# Patient Record
Sex: Male | Born: 1985 | Race: White | Hispanic: No | Marital: Married | Smoking: Current every day smoker
Health system: Southern US, Community
[De-identification: ages and names within clinical notes are randomized; demographics above are authoritative.]

---

## 2017-03-28 ENCOUNTER — Encounter (HOSPITAL_COMMUNITY): Payer: Self-pay

## 2017-03-28 ENCOUNTER — Emergency Department (HOSPITAL_COMMUNITY): Payer: Self-pay

## 2017-03-28 ENCOUNTER — Emergency Department (HOSPITAL_COMMUNITY)
Admission: EM | Admit: 2017-03-28 | Discharge: 2017-03-28 | Disposition: A | Discharge status: Home / Self Care | Payer: Self-pay | Attending: Emergency Medicine | Admitting: Emergency Medicine

## 2017-03-28 DIAGNOSIS — F17210 Nicotine dependence, cigarettes, uncomplicated: Secondary | ICD-10-CM | POA: Insufficient documentation

## 2017-03-28 DIAGNOSIS — K2920 Alcoholic gastritis without bleeding: Secondary | ICD-10-CM | POA: Insufficient documentation

## 2017-03-28 LAB — COMPREHENSIVE METABOLIC PANEL
ALK PHOS: 109 U/L (ref 38–126)
ALT: 108 U/L — AB (ref 17–63)
AST: 99 U/L — AB (ref 15–41)
Albumin: 4.6 g/dL (ref 3.5–5.0)
Anion gap: 12 (ref 5–15)
BUN: 8 mg/dL (ref 6–20)
CALCIUM: 9.4 mg/dL (ref 8.9–10.3)
CO2: 26 mmol/L (ref 22–32)
CREATININE: 0.86 mg/dL (ref 0.61–1.24)
Chloride: 101 mmol/L (ref 101–111)
Glucose, Bld: 102 mg/dL — ABNORMAL HIGH (ref 65–99)
Potassium: 3.8 mmol/L (ref 3.5–5.1)
Sodium: 139 mmol/L (ref 135–145)
Total Bilirubin: 1.2 mg/dL (ref 0.3–1.2)
Total Protein: 8.3 g/dL — ABNORMAL HIGH (ref 6.5–8.1)

## 2017-03-28 LAB — URINALYSIS, ROUTINE W REFLEX MICROSCOPIC
Bacteria, UA: NONE SEEN
Bilirubin Urine: NEGATIVE
GLUCOSE, UA: NEGATIVE mg/dL
KETONES UR: NEGATIVE mg/dL
LEUKOCYTES UA: NEGATIVE
Nitrite: NEGATIVE
PH: 7 (ref 5.0–8.0)
Protein, ur: >=300 mg/dL — AB
SPECIFIC GRAVITY, URINE: 1.019 (ref 1.005–1.030)
SQUAMOUS EPITHELIAL / LPF: NONE SEEN

## 2017-03-28 LAB — I-STAT TROPONIN, ED: Troponin i, poc: 0 ng/mL (ref 0.00–0.08)

## 2017-03-28 LAB — CBC
HCT: 48.3 % (ref 39.0–52.0)
Hemoglobin: 17.2 g/dL — ABNORMAL HIGH (ref 13.0–17.0)
MCH: 33.2 pg (ref 26.0–34.0)
MCHC: 35.6 g/dL (ref 30.0–36.0)
MCV: 93.2 fL (ref 78.0–100.0)
PLATELETS: 357 10*3/uL (ref 150–400)
RBC: 5.18 MIL/uL (ref 4.22–5.81)
RDW: 12.2 % (ref 11.5–15.5)
WBC: 13.5 10*3/uL — AB (ref 4.0–10.5)

## 2017-03-28 LAB — LIPASE, BLOOD: Lipase: 26 U/L (ref 11–51)

## 2017-03-28 MED ORDER — SODIUM CHLORIDE 0.9 % IV BOLUS (SEPSIS)
1000.0000 mL | Freq: Once | INTRAVENOUS | Status: AC
  Administered 2017-03-28: 1000 mL via INTRAVENOUS

## 2017-03-28 MED ORDER — ONDANSETRON 8 MG PO TBDP: ORAL_TABLET | ORAL | 0 refills | Status: AC

## 2017-03-28 MED ORDER — PANTOPRAZOLE SODIUM 20 MG PO TBEC: 20.0000 mg | DELAYED_RELEASE_TABLET | Freq: Two times a day (BID) | ORAL | 0 refills | Status: AC

## 2017-03-28 MED ORDER — GI COCKTAIL ~~LOC~~
30.0000 mL | Freq: Once | ORAL | Status: AC
  Administered 2017-03-28: 30 mL via ORAL
  Filled 2017-03-28: qty 30

## 2017-03-28 MED ORDER — ONDANSETRON 4 MG PO TBDP
4.0000 mg | ORAL_TABLET | Freq: Once | ORAL | Status: AC | PRN
  Administered 2017-03-28: 4 mg via ORAL
  Filled 2017-03-28: qty 1

## 2017-03-28 NOTE — ED Triage Notes (Signed)
Pt c/o intermittent epigastric pain and emesis x 6 months increasing this morning.  Pain score 6/10.  Pt has not taken anything for pain.  Pt reports he is a heavy drinker and drink every night.

## 2017-03-28 NOTE — ED Provider Notes (Signed)
[facility]-EMERGENCY DEPT Provider Note   CSN: [id number] Arrival date & time: 03/28/17  1353     History   Chief Complaint Chief Complaint  Patient presents with  . Heartburn  . Emesis    HPI Clarence Martin is a 31 y.o. male.  Patient is a 31 year old male with past medical history of chronic alcoholism presenting with complaints of epigastric discomfort. This is been ongoing intermittently for the past several months, but has worsened over the past few days. He reports burning to his epigastric region that is worse with drinking alcohol and food. He denies any fevers or chills. He denies any diarrhea. He states that he had multiple episodes of vomiting this morning and that prompted his visit this evening.   The history is provided by the patient.  Heartburn  This is a new problem. Episode onset: Several months ago. The problem occurs constantly. The problem has been gradually worsening. Exacerbated by: Eating and drinking. Nothing relieves the symptoms. He has tried nothing for the symptoms.    History reviewed. No pertinent past medical history.  There are no active problems to display for this patient.   History reviewed. No pertinent surgical history.     Home Medications    Prior to Admission medications   Not on File    Family History History reviewed. No pertinent family history.  Social History Social History  Substance Use Topics  . Smoking status: Current Every Day Smoker    Packs/day: 0.50    Types: Cigarettes  . Smokeless tobacco: Never Used  . Alcohol use Yes     Comment: heavy     Allergies   Patient has no allergy information on record.   Review of Systems Review of Systems  Gastrointestinal: Positive for heartburn.  All other systems reviewed and are negative.    Physical Exam Updated Vital Signs BP (!) 153/107 (BP Location: Left Arm)   Pulse (!) 105   Temp 99.8 F (37.7 C) (Oral)   Resp 18   Ht 6\' 2"  (1.88 m)   Wt 77.1 kg  (170 lb)   SpO2 100%   BMI 21.83 kg/m   Physical Exam  Constitutional: He is oriented to person, place, and time. He appears well-developed and well-nourished. No distress.  HENT:  Head: Normocephalic and atraumatic.  Mouth/Throat: Oropharynx is clear and moist.  Neck: Normal range of motion. Neck supple.  Cardiovascular: Normal rate and regular rhythm.  Exam reveals no friction rub.   No murmur heard. Pulmonary/Chest: Effort normal and breath sounds normal. No respiratory distress. He has no wheezes. He has no rales.  Abdominal: Soft. Bowel sounds are normal. He exhibits no distension. There is tenderness. There is no rebound and no guarding.  There is mild tenderness to palpation in the epigastric region.  Musculoskeletal: Normal range of motion. He exhibits no edema.  Neurological: He is alert and oriented to person, place, and time. Coordination normal.  Skin: Skin is warm and dry. He is not diaphoretic.  Nursing note and vitals reviewed.    ED Treatments / Results  Labs (all labs ordered are listed, but only abnormal results are displayed) Labs Reviewed  LIPASE, BLOOD  COMPREHENSIVE METABOLIC PANEL  CBC  URINALYSIS, ROUTINE W REFLEX MICROSCOPIC  I-STAT TROPOININ, ED    EKG  EKG Interpretation  Date/Time:  Monday March 28 2017 15:50:04 EDT Ventricular Rate:  92 PR Interval:    QRS Duration: 85 QT Interval:  340 QTC Calculation: 421 R Axis:  102 Text Interpretation:  Sinus rhythm RSR' in V1 or V2, probably normal variant Baseline wander in lead(s) V2 V3 Confirmed by Geoffery LyonseLo, Gabriellia Rempel ([id number]) on 03/28/2017 4:02:26 PM       Radiology No results found.  Procedures Procedures (including critical care time)  Medications Ordered in ED Medications  ondansetron (ZOFRAN-ODT) disintegrating tablet 4 mg (not administered)  sodium chloride 0.9 % bolus 1,000 mL (not administered)  gi cocktail (Maalox,Lidocaine,Donnatal) (not administered)     Initial Impression /  Assessment and Plan / ED Course  I have reviewed the triage vital signs and the nursing notes.  Pertinent labs & imaging results that were available during my care of the patient were reviewed by me and considered in my medical decision making (see chart for details).  Patient presents here with complaints of epigastric burning that has been ongoing for the past several months, then worse today. His presentation sounds like a gastritis, most likely related to alcohol. He is a daily drinker of liquor. He is feeling better after a GI cocktail and IV fluids. He will be discharged with protonix, Zofran, and when necessary follow-up with gastroenterology if he is not improving to discuss an endoscopy.  Final Clinical Impressions(s) / ED Diagnoses   Final diagnoses:  None    New Prescriptions New Prescriptions   No medications on file     Geoffery Lyonselo, Queena Monrreal, MD 03/28/17 1758

## 2017-03-28 NOTE — ED Notes (Signed)
ED Provider at bedside. 

## 2017-03-28 NOTE — Discharge Instructions (Signed)
Protonix as prescribed.  Zofran as prescribed as needed for nausea.  Limit your alcohol intake.  Follow[facility] if your symptoms are not improving in the next several days. The contact information for [facility] has been provided in this discharge summary for you to call and make these arrangements.

## 2018-03-09 IMAGING — CR DG CHEST 2V
2 series · 2 of 2 positions shown · non-contrast
Comparison: None in PACs

CLINICAL DATA: Intermittent epigastric pain and emesis for the past
6 months with increased severity this morning.

EXAM:
CHEST  2 VIEW

[w chest pa]
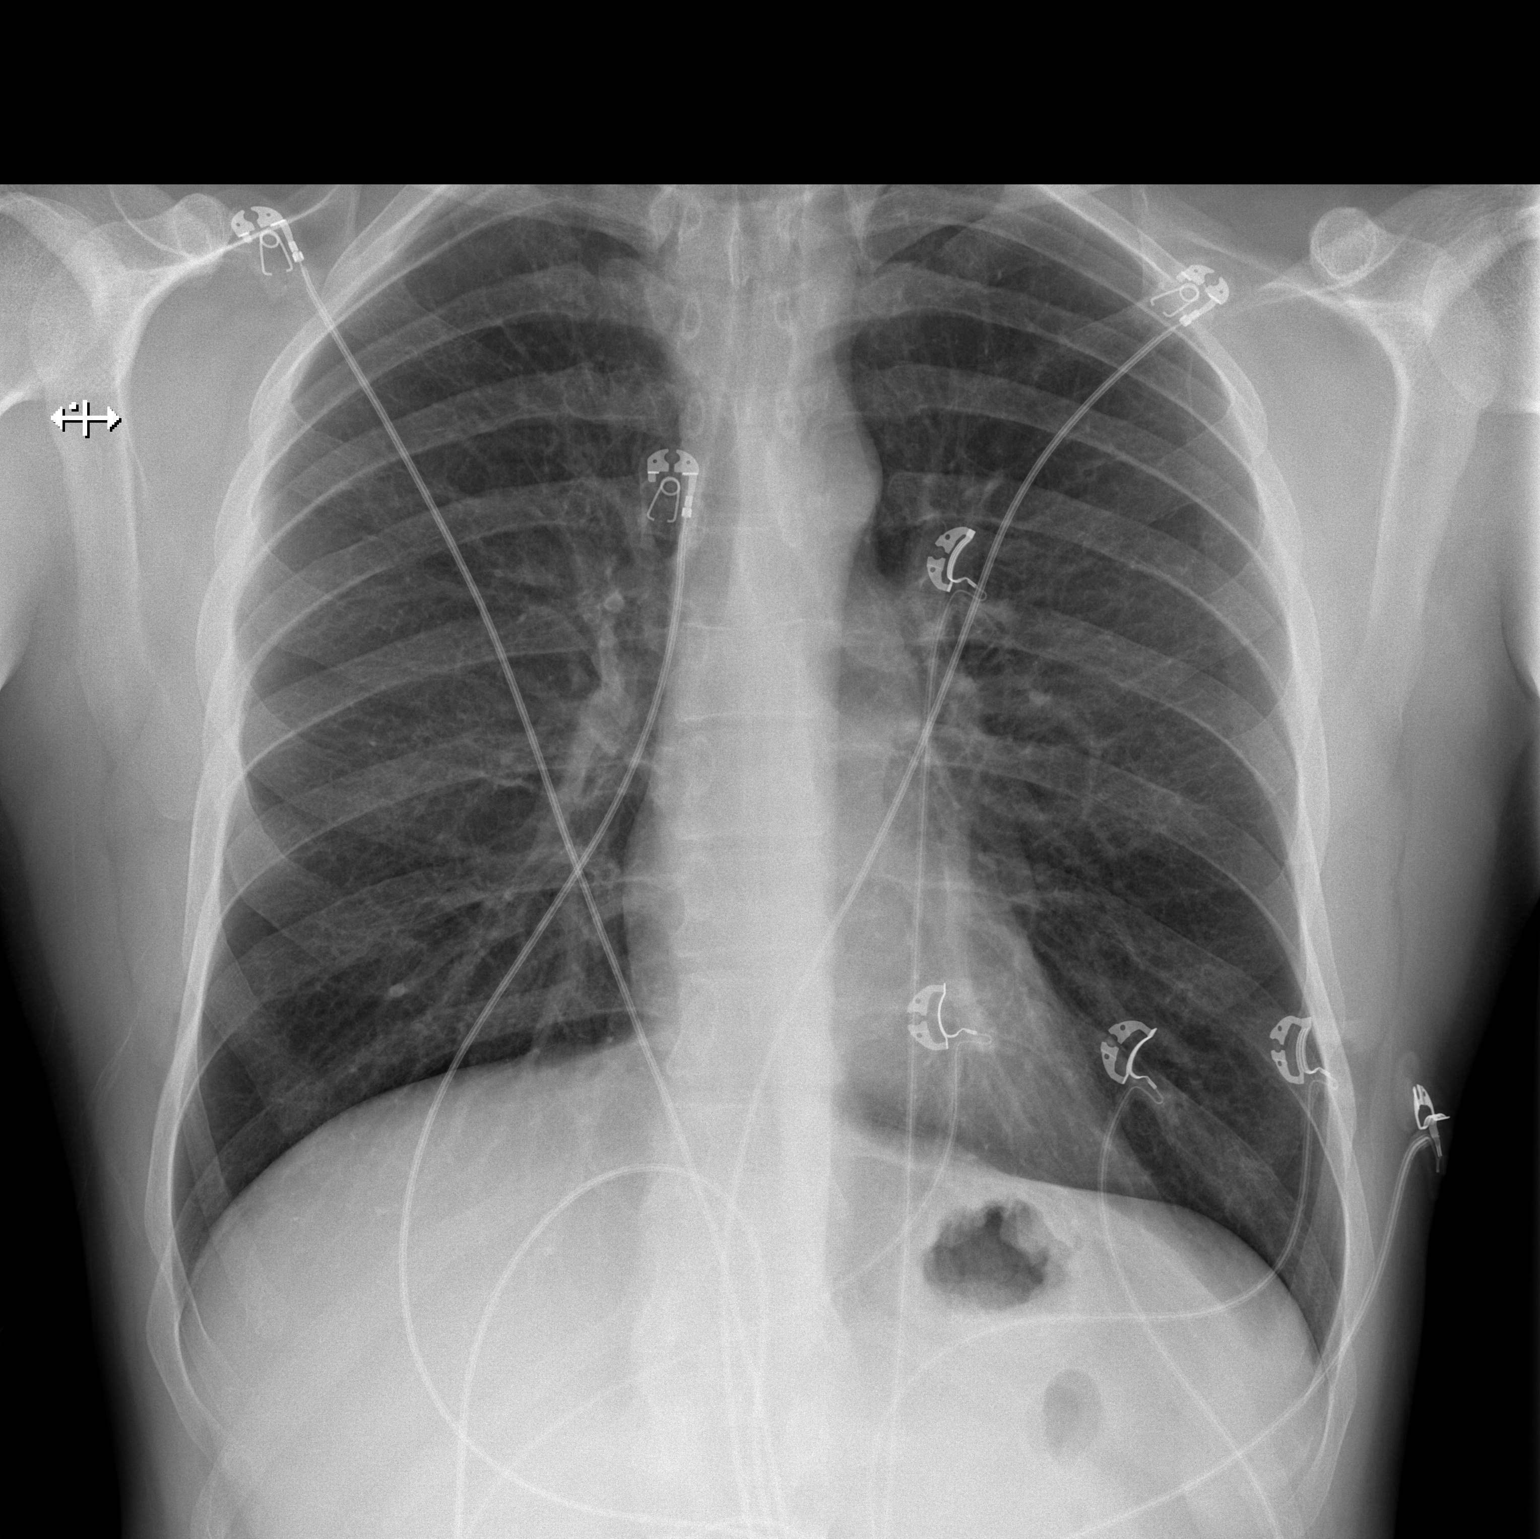

[w chest lat]
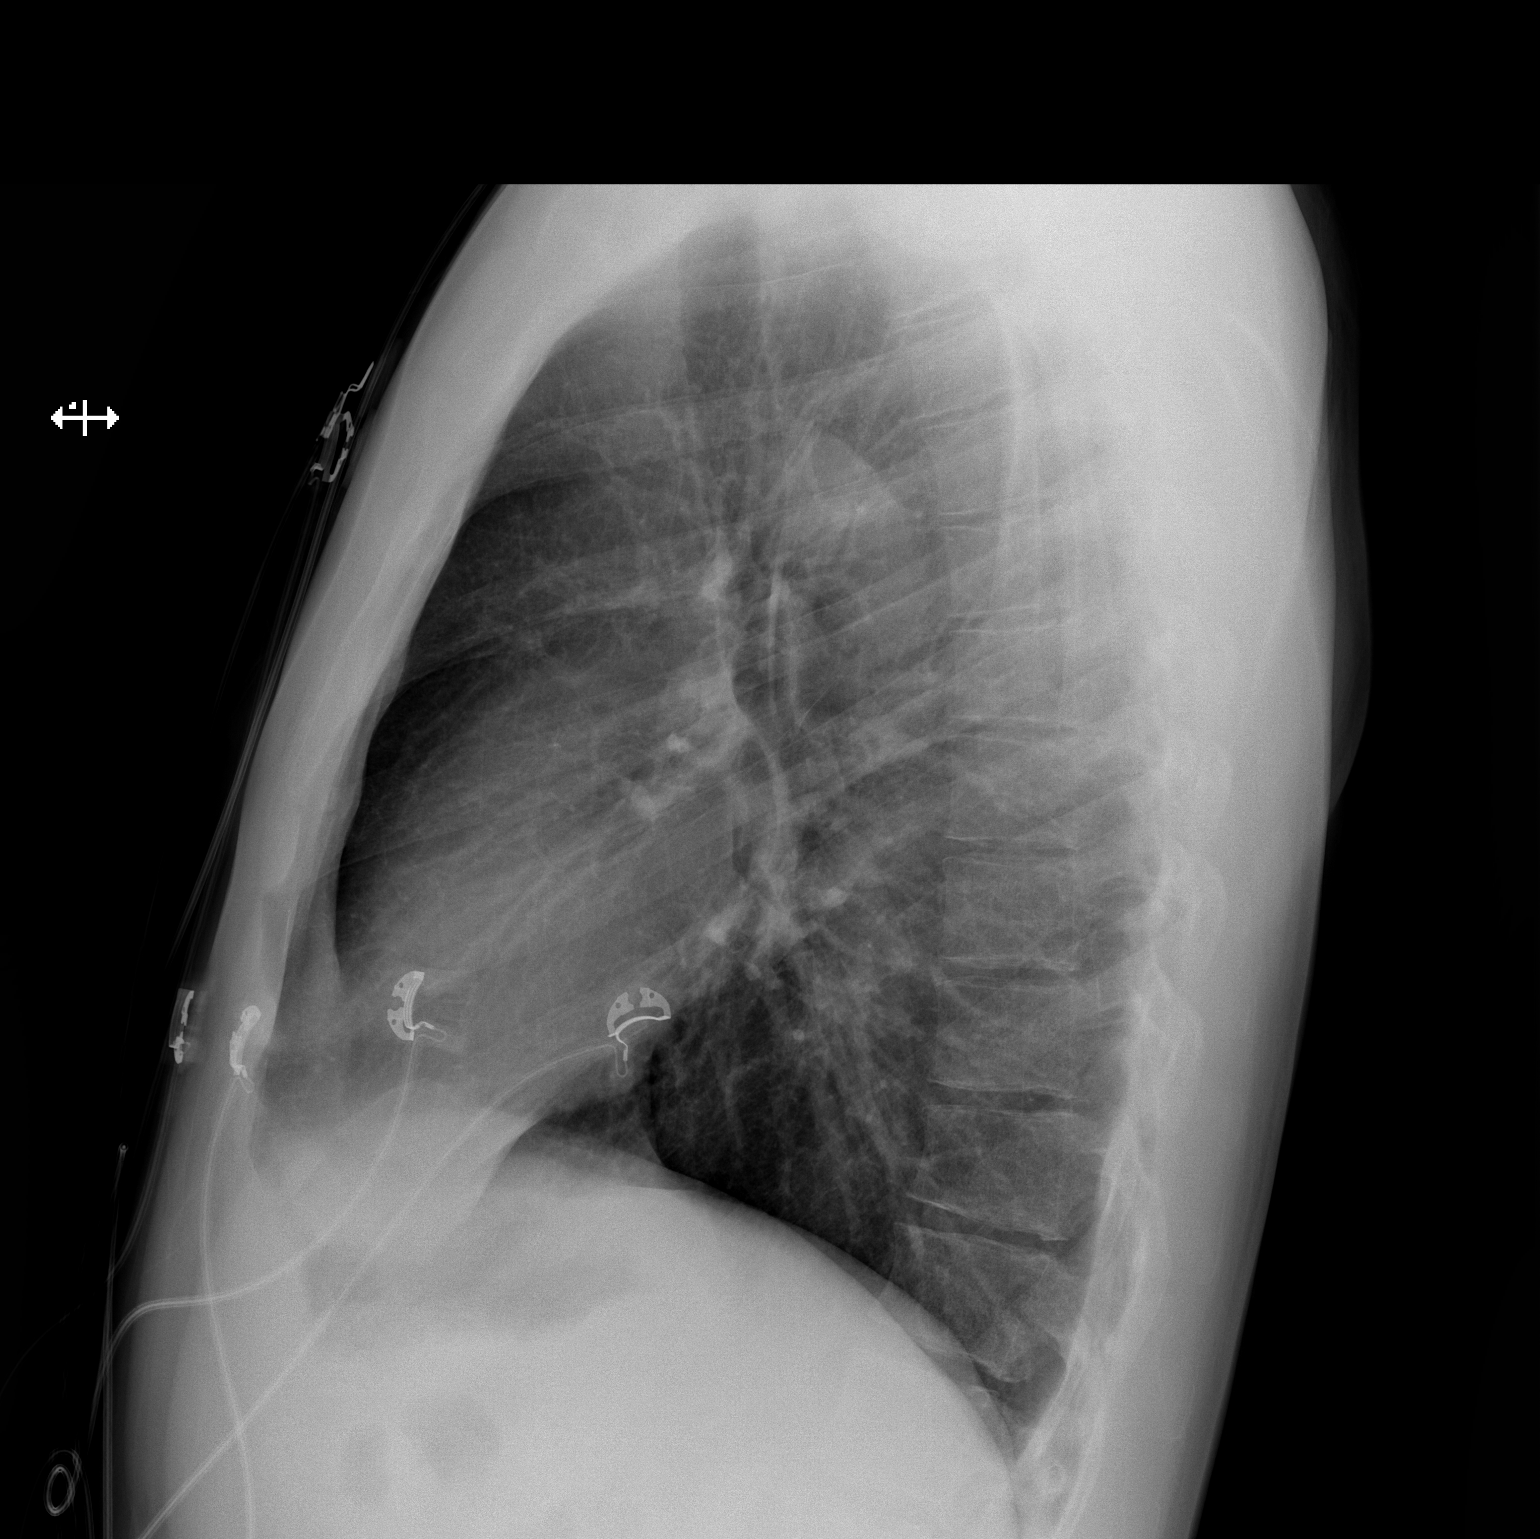

[2 of 2 positions shown; findings below may reference images not displayed]

FINDINGS: The lungs are adequately inflated. The interstitial markings are
mildly increased. There is no focal infiltrate or pleural effusion.
The heart and pulmonary vascularity are normal. The mediastinum is
normal in width. There is no visible hiatal hernia. The bony thorax
is unremarkable. The gas pattern in the upper abdomen is normal.
IMPRESSION: Old interstitial prominence is compatible with the patient's smoking
history. There is no acute cardiopulmonary abnormality.
